# Patient Record
Sex: Female | Born: 2000 | Race: Black or African American | Hispanic: No | Marital: Single | Smoking: Never smoker
Health system: Southern US, Community
[De-identification: ages and names within clinical notes are randomized; demographics above are authoritative.]

## PROBLEM LIST (undated history)

## (undated) DIAGNOSIS — J309 Allergic rhinitis, unspecified: Secondary | ICD-10-CM

## (undated) DIAGNOSIS — J45909 Unspecified asthma, uncomplicated: Secondary | ICD-10-CM

## (undated) HISTORY — PX: TONSILLECTOMY: SUR1361

## (undated) HISTORY — DX: Allergic rhinitis, unspecified: J30.9

## (undated) HISTORY — PX: ADENOIDECTOMY: SUR15

---
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
## 2022-06-12 ENCOUNTER — Encounter: Payer: Medicaid Other | Admitting: Radiology

## 2022-08-24 ENCOUNTER — Encounter (INDEPENDENT_AMBULATORY_CARE_PROVIDER_SITE_OTHER): Payer: Self-pay

## 2022-10-10 ENCOUNTER — Ambulatory Visit
Admission: EM | Admit: 2022-10-10 | Discharge: 2022-10-10 | Disposition: A | Discharge status: Home / Self Care | Payer: Medicaid Other | Attending: Emergency Medicine | Admitting: Emergency Medicine

## 2022-10-10 DIAGNOSIS — J302 Other seasonal allergic rhinitis: Secondary | ICD-10-CM

## 2022-10-10 MED ORDER — FLUTICASONE PROPIONATE 50 MCG/ACT NA SUSP: 1.0000 | Freq: Every day | NASAL | 0 refills | Status: DC

## 2022-10-10 MED ORDER — CETIRIZINE HCL 10 MG PO TABS: 10.0000 mg | ORAL_TABLET | Freq: Every day | ORAL | 0 refills | Status: DC

## 2022-10-10 NOTE — ED Triage Notes (Signed)
Patient to Urgent Care with complaints of productive cough. Reports mucus is discolored.   Reports symptoms started two weeks ago. Reports also having headaches.

## 2022-10-10 NOTE — ED Provider Notes (Signed)
Roderic Palau    CSN: [id number] Arrival date & time: 10/10/22  1202      History   Chief Complaint Chief Complaint  Patient presents with   Cough    HPI Caitlin English is a 22 y.o. female.  Patient presents with 2 week history of intermittent headache, congestion, productive cough.  No fever, chills, ear pain, sore throat, wheezing, shortness of breath, or other symptoms.  No OTC medications taken today.  She has not needed to use her albuterol inhaler.  Her medical history includes asthma and allergies.   The history is provided by the patient and medical records.    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      Home Medications    Prior to Admission medications   Medication Sig Start Date End Date Taking? Authorizing Provider  cetirizine (ZYRTEC ALLERGY) 10 MG tablet Take 1 tablet (10 mg total) by mouth daily. 10/10/22  Yes Sharion Balloon, NP  fluticasone (FLONASE) 50 MCG/ACT nasal spray Place 1 spray into both nostrils daily. 10/10/22  Yes Sharion Balloon, NP  norethindrone-ethinyl estradiol-FE (JUNEL FE 1/20) 1-20 MG-MCG tablet TAKE 1 TABLET BY MOUTH ONCE DAILY Patient not taking: Reported on 10/10/2022 04/27/21   Theodis Sato, MD  VENTOLIN HFA 108 (90 Base) MCG/ACT inhaler Inhale 2 puffs into the  lungs every 4 (four) hours as needed for wheezing or shortness of breath. 05/03/21   Roselind Messier, MD    Family History Family History  Problem Relation Age of Onset   Hypertension Mother    Asthma Mother    Diabetes Father    Hypertension Father    Diabetes Maternal Aunt    Other Other        family history of anxiety/depression   Heart attack Maternal Grandfather     Social History Social History   Tobacco Use   Smoking status: Never    Passive exposure: Yes   Smokeless tobacco: Never   Tobacco comments:    dad outside   Substance Use Topics   Alcohol use: No   Drug use: No     Allergies   Patient has no known allergies.   Review of Systems Review of Systems  Constitutional:  Negative for chills and fever.  HENT:  Positive for congestion. Negative for ear pain and sore throat.   Respiratory:  Positive for cough. Negative for shortness of breath and wheezing.   Cardiovascular:  Negative for chest pain and palpitations.  Gastrointestinal:  Negative for diarrhea and vomiting.  Skin:  Negative for color change and rash.  Neurological:  Positive for headaches.  All other systems reviewed and are negative.    Physical Exam Triage Vital Signs ED Triage Vitals  Enc Vitals Group  BP 10/10/22 1212 114/78     Pulse Rate 10/10/22 1210 89     Resp 10/10/22 1210 18     Temp 10/10/22 1210 98.1 F (36.7 C)     Temp src --      SpO2 10/10/22 1210 98 %     Weight 10/10/22 1211 165 lb (74.8 kg)     Height 10/10/22 1211 5\' 4"  (1.626 m)     Head Circumference --      Peak Flow --      Pain Score 10/10/22 1211 0     Pain Loc --      Pain Edu? --      Excl. in [place]? --    No data found.  Updated Vital Signs BP 114/78   Pulse 89   Temp 98.1 F (36.7 C)   Resp 18   Ht 5\' 4"  (1.626 m)   Wt 165 lb (74.8 kg)   LMP 10/07/2022   SpO2 98%   BMI 28.32 kg/m   Visual Acuity Right Eye Distance:   Left Eye Distance:   Bilateral Distance:    Right Eye Near:    Left Eye Near:    Bilateral Near:     Physical Exam Vitals and nursing note reviewed.  Constitutional:      General: She is not in acute distress.    Appearance: Normal appearance. She is well-developed. She is not ill-appearing.  HENT:     Right Ear: Tympanic membrane normal.     Left Ear: Tympanic membrane normal.     Nose: Nose normal.     Mouth/Throat:     Mouth: Mucous membranes are moist.     Pharynx: Oropharynx is clear.  Cardiovascular:     Rate and Rhythm: Normal rate and regular rhythm.     Heart sounds: Normal heart sounds.  Pulmonary:     Effort: Pulmonary effort is normal. No respiratory distress.     Breath sounds: Normal breath sounds. No wheezing.  Musculoskeletal:     Cervical back: Neck supple.  Skin:    General: Skin is warm and dry.  Neurological:     Mental Status: She is alert.  Psychiatric:        Mood and Affect: Mood normal.        Behavior: Behavior normal.      UC Treatments / Results  Labs (all labs ordered are listed, but only abnormal results are displayed) Labs Reviewed - No data to display  EKG   Radiology No results found.  Procedures Procedures (including critical care time)  Medications Ordered in UC Medications - No data to display  Initial Impression / Assessment and Plan / UC Course  I have reviewed the triage vital signs and the nursing notes.  Pertinent labs & imaging results that were available during my care of the patient were reviewed by me and considered in my medical decision making (see chart for details).    Seasonal allergies.  Patient is well-appearing and her exam is reassuring.  Lungs are clear, O2 sat 98% on room air.  Treating with Flonase and Zyrtec.  Education provided on allergic rhinitis.  Instructed patient to follow up with her PCP if her symptoms are not improving.  She agrees to plan of care.    Final Clinical Impressions(s) / UC Diagnoses   Final diagnoses:  Seasonal allergies      Discharge Instructions      Use the Flonase and take the Zyrtec as directed.  Follow up with your primary care provider if your symptoms are not improving.        ED Prescriptions     Medication Sig Dispense Auth. Provider   fluticasone (FLONASE) 50 MCG/ACT nasal spray Place 1 spray into both nostrils daily. 16 g Sharion Balloon, NP   cetirizine (ZYRTEC ALLERGY) 10 MG tablet Take 1 tablet (10 mg total) by mouth daily. 14 tablet Sharion Balloon, NP      PDMP not reviewed this encounter.   Sharion Balloon, NP 10/10/22 1236

## 2022-10-10 NOTE — Discharge Instructions (Addendum)
Use the Flonase and take the Zyrtec as directed.  Follow up with your primary care provider if your symptoms are not improving.

## 2022-10-10 NOTE — ED Notes (Signed)
Appointment made for patient to establish pcp. Appointment made with Romilda Garret. Dec 08, 2022 @ [place] at [place].

## 2022-11-27 ENCOUNTER — Encounter (INDEPENDENT_AMBULATORY_CARE_PROVIDER_SITE_OTHER): Payer: Self-pay

## 2022-12-08 ENCOUNTER — Ambulatory Visit: Payer: Medicaid Other | Admitting: Nurse Practitioner

## 2023-01-18 ENCOUNTER — Ambulatory Visit
Admission: EM | Admit: 2023-01-18 | Discharge: 2023-01-18 | Disposition: A | Discharge status: Home / Self Care | Payer: Medicaid Other | Attending: Urgent Care | Admitting: Urgent Care

## 2023-01-18 DIAGNOSIS — L732 Hidradenitis suppurativa: Secondary | ICD-10-CM | POA: Diagnosis not present

## 2023-01-18 DIAGNOSIS — L02411 Cutaneous abscess of right axilla: Secondary | ICD-10-CM

## 2023-01-18 MED ORDER — DOXYCYCLINE HYCLATE 100 MG PO CAPS: 100.0000 mg | ORAL_CAPSULE | Freq: Two times a day (BID) | ORAL | 0 refills | Status: AC

## 2023-01-18 NOTE — Discharge Instructions (Signed)
I have entered a referral for you for dermatology.  Please contact their office to schedule an appointment.  I have also prescribed an antibiotic which is likely to help with your symptoms.  As discussed, if there is a pocket of discharge within the lesion, the antibiotic is not likely to resolve it without surgically draining it.  If you are unable to get a timely appointment with dermatology, you may return to urgent care for this procedure if your symptoms worsen.

## 2023-01-18 NOTE — ED Provider Notes (Signed)
Renaldo Fiddler    CSN: [id number] Arrival date & time: 01/18/23  1310      History   Chief Complaint Chief Complaint  Patient presents with   Abscess    HPI Caitlin English is a 22 y.o. female.    Abscess   Presents with complaint of abscess to right axilla.  Patient states it increased in size yesterday.  Denies drainage and fever.  PMH includes moderate persistent asthma, hidradenitis.  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      Home Medications    Prior to Admission medications   Medication Sig Start Date End Date Taking? Authorizing Provider  cetirizine (ZYRTEC ALLERGY) 10 MG tablet Take 1 tablet (10 mg total) by mouth daily. 10/10/22   Mickie Bail, NP  fluticasone (FLONASE) 50 MCG/ACT nasal spray Place 1 spray into both nostrils daily. 10/10/22   Mickie Bail, NP  norethindrone-ethinyl estradiol-FE (JUNEL FE 1/20) 1-20 MG-MCG tablet TAKE 1 TABLET BY MOUTH ONCE DAILY Patient not taking: Reported on 10/10/2022 04/27/21   Darrall Dears, MD  VENTOLIN HFA 108 (90 Base) MCG/ACT inhaler Inhale 2 puffs into the lungs every 4 (four) hours as needed for wheezing or shortness of breath. 05/03/21   Theadore Nan, MD    Family History Family History  Problem Relation Age of Onset    Hypertension Mother    Asthma Mother    Diabetes Father    Hypertension Father    Diabetes Maternal Aunt    Other Other        family history of anxiety/depression   Heart attack Maternal Grandfather     Social History Social History   Tobacco Use   Smoking status: Never    Passive exposure: Yes   Smokeless tobacco: Never   Tobacco comments:    dad outside   Substance Use Topics   Alcohol use: No   Drug use: No     Allergies   Patient has no known allergies.   Review of Systems Review of Systems   Physical Exam Triage Vital Signs ED Triage Vitals  Enc Vitals Group     BP      Pulse      Resp      Temp      Temp src      SpO2      Weight      Height      Head Circumference      Peak Flow      Pain Score      Pain Loc      Pain Edu?      Excl. in [place]?    No data found.  Updated Vital Signs There were no vitals taken for this visit.  Visual Acuity  Right Eye Distance:   Left Eye Distance:   Bilateral Distance:    Right Eye Near:   Left Eye Near:    Bilateral Near:     Physical Exam Vitals reviewed.  Constitutional:      Appearance: Normal appearance.  Chest:    Skin:    General: Skin is warm and dry.  Neurological:     General: No focal deficit present.     Mental Status: She is alert and oriented to person, place, and time.  Psychiatric:        Mood and Affect: Mood normal.        Behavior: Behavior normal.      UC Treatments / Results  Labs (all labs ordered are listed, but only abnormal results are displayed) Labs Reviewed - No data to display  EKG   Radiology No results found.  Procedures Procedures (including critical care time)  Medications Ordered in UC Medications - No data to display  Initial Impression / Assessment and Plan / UC Course  I have reviewed the triage vital signs and the nursing notes.  Pertinent labs & imaging results that were available during my care of the patient were reviewed by me and  considered in my medical decision making (see chart for details).   Given her history and location of the lesion, suspicious for recurrence of HS.  The lesion is not clearly fluctuant and after shared decision making, agreed to limit treatment to prescribed antibiotic and attempt referral to dermatology for evaluation and treatment.  Counseled patient on potential for adverse effects with medications prescribed/recommended today, ER and return-to-clinic precautions discussed, patient verbalized understanding and agreement with care plan.  Final Clinical Impressions(s) / UC Diagnoses   Final diagnoses:  None   Discharge Instructions   None    ED Prescriptions   None    PDMP not reviewed this encounter.   Charma Igo, [place] 01/18/23 1326

## 2023-01-18 NOTE — ED Triage Notes (Signed)
Patient presents to UC abscess to right axilla, states it increased in size yesterday. No drainage or fever.

## 2023-01-21 ENCOUNTER — Encounter: Payer: Self-pay | Admitting: Emergency Medicine

## 2023-01-21 ENCOUNTER — Emergency Department
Admission: EM | Admit: 2023-01-21 | Discharge: 2023-01-21 | Disposition: A | Discharge status: Home / Self Care | Payer: Medicaid Other | Attending: Emergency Medicine | Admitting: Emergency Medicine

## 2023-01-21 ENCOUNTER — Other Ambulatory Visit: Payer: Self-pay

## 2023-01-21 DIAGNOSIS — L02411 Cutaneous abscess of right axilla: Secondary | ICD-10-CM | POA: Diagnosis present

## 2023-01-21 MED ORDER — LIDOCAINE-EPINEPHRINE-TETRACAINE (LET) TOPICAL GEL
3.0000 mL | Freq: Once | TOPICAL | Status: AC
  Administered 2023-01-21: 3 mL via TOPICAL
  Filled 2023-01-21: qty 3

## 2023-01-21 MED ORDER — LIDOCAINE-EPINEPHRINE (PF) 2 %-1:200000 IJ SOLN
10.0000 mL | Freq: Once | INTRAMUSCULAR | Status: DC
  Filled 2023-01-21: qty 20

## 2023-01-21 MED ORDER — LIDOCAINE HCL (PF) 1 % IJ SOLN
5.0000 mL | Freq: Once | INTRAMUSCULAR | Status: AC
  Administered 2023-01-21: 5 mL
  Filled 2023-01-21: qty 5

## 2023-01-21 MED ORDER — CLINDAMYCIN PHOSPHATE 1 % EX GEL: CUTANEOUS | 0 refills | Status: DC

## 2023-01-21 NOTE — Discharge Instructions (Addendum)
Put a heat pack or a warm, wet washcloth on the spot. Make sure you: Cover the abscess with a bandage. Wash your hands with soap and water for at least 20 seconds before and after you change your bandage. If you cannot use soap and water, use hand sanitizer. Change your bandage Check for: More redness, swelling, or pain. More fluid or blood. Warmth. More pus or a worse smell.  Follow Up in 2-3 days for removal of packaging and wound check   Please go to the following website to schedule new (and existing) patient appointments:   [email or web address]   The following is a list of primary care offices in the area who are accepting new patients at this time.  Please reach out to one of them directly and let them know you would like to schedule an appointment to follow up on an Emergency Department visit, and/or to establish a new primary care provider (PCP).  There are likely other primary care clinics in the are who are accepting new patients, but this is an excellent place to start:  [facility] Lead physician: Dr Shirlee Latch [address] [place], [place] [address] [phone]  [facility] Lead Physician: Dr Alba Cory [address], [place], [place] [address] [phone]  [facility]  Lead Physician: Dr Olevia Perches [address] [place], [place] [address] [phone]  [facility] Lead Physician: Dr Sofie Hartigan [address] [place], [place] [address] [phone]  [facility] at [facility] Lead Physician: Dr Bari Edward [address] [place], [place], [place] [address] [phone]

## 2023-01-21 NOTE — ED Notes (Signed)
See triage note  Presents with possible abscess area under right arm  States she developed area last week    States she was seen and placed on antibiotics and has been using warm compresses  No fever

## 2023-01-21 NOTE — ED Triage Notes (Signed)
Patient is here with bilateral axilla abscesses; RIGHT is about the size of a golf ball and LEFT is very small

## 2023-01-21 NOTE — ED Provider Notes (Addendum)
[facility] Emergency Department Provider Note     Event Date/Time   First MD Initiated Contact with Patient 01/21/23 1159     (approximate)   History   Abscess (Patient is here with bilateral axilla abscesses; RIGHT is about the size of a golf ball and LEFT is very small)   HPI  Caitlin English is a 22 y.o. female with a history of hidradenitis suppurativa who presents to the ED for right axillary pain due to an abscess.  Patient was seen at urgent care 3 days ago and prescribed doxycycline for same complaint.  Patient admits to not picking up this medication.  Patient reports taking an old prescription of doxycycline that she had leftover.  She has taken 2 pills of this old prescription but not as instructed on the prescription.  Patient has a history of reoccurring abscess in both axillary areas.  She states they normally drain on their own.  She has noticed the abscess in her right axillary to increase in size over 2 days and pain is intolerable.  Denies fever and drainage.     Physical Exam   Triage Vital Signs: ED Triage Vitals  Enc Vitals Group     BP 01/21/23 1147 116/79     Pulse Rate 01/21/23 1147 75     Resp 01/21/23 1147 14     Temp 01/21/23 1147 98 F (36.7 C)     Temp Source 01/21/23 1147 Oral     SpO2 01/21/23 1147 100 %     Weight 01/21/23 1147 160 lb (72.6 kg)     Height 01/21/23 1147 5\' 4"  (1.626 m)     Head Circumference --      Peak Flow --      Pain Score 01/21/23 1146 6     Pain Loc --      Pain Edu? --      Excl. in [place]? --     Most recent vital signs: Vitals:   01/21/23 1147  BP: 116/79  Pulse: 75  Resp: 14  Temp: 98 F (36.7 C)  SpO2: 100%    General Awake, no distress.  HEENT NCAT. PERRL. EOMI. No rhinorrhea. Mucous membranes are moist. CV:  Good peripheral perfusion.  RESP:  Normal effort.  ABD:  No distention.  Other:  Right axillary reveals large sized raised, mobile, fluctuant mass with surrounding firm  induration.  There is a small pinpoint white head located center of mass. severe tenderness to palpation. No erythema.   ED Results / Procedures / Treatments   Labs (all labs ordered are listed, but only abnormal results are displayed) Labs Reviewed - No data to display  No results found.  PROCEDURES:  Critical Care performed: No  ..Incision and Drainage  Date/Time: 01/21/2023 2:41 PM  Performed by: Conrad Oak Level, PA-C Authorized by: Kern Reap A, PA-C   Consent:    Consent obtained:  Verbal   Consent given by:  Patient   Risks, benefits, and alternatives were discussed: yes     Risks discussed:  Bleeding, incomplete drainage, pain and infection   Alternatives discussed:  Observation and alternative treatment Location:    Type:  Abscess   Size:  3 cm   Location:  Upper extremity   Upper extremity location:  Arm   Arm location:  R upper arm (Axillary) Pre-procedure details:    Skin preparation:  Povidone-iodine Anesthesia:    Anesthesia method:  Topical application and local infiltration  Topical anesthetic:  LET   Local anesthetic:  Lidocaine 1% w/o epi Procedure type:    Complexity:  Complex Procedure details:    Incision types:  Stab incision   Incision depth:  Subcutaneous   Wound management:  Irrigated with saline   Drainage:  Purulent and bloody   Drainage amount:  Moderate   Packing materials:  1/4 in iodoform gauze   Amount 1/4" iodoform:  1 inch Post-procedure details:    Procedure completion:  Tolerated    MEDICATIONS ORDERED IN ED: Medications  lidocaine-EPINEPHrine-tetracaine (LET) topical gel (3 mLs Topical Given 01/21/23 1226)  lidocaine (PF) (XYLOCAINE) 1 % injection 5 mL (5 mLs Infiltration Given by Other 01/21/23 1226)   IMPRESSION / MDM / ASSESSMENT AND PLAN / ED COURSE  I reviewed the triage vital signs and the nursing notes.                               22 y.o. female presents to the emergency department for evaluation and  treatment of reoccurring hidradenitis suppurativa abscess of the right axillary. See HPI for further details.   Right axillary reveals a large fluctuant and mobile mass with a small white head prepared for drainage.  I&D procedure performed successfully.  Patient tolerated well with no complications.  Abscess was irrigated extensively with normal saline and packed with approximately 1 inch of iodoform packing strips.  A nonadhesive dressing was placed over the area.  We briefly discussed at home care.  I instructed her to pick up the doxycycline prescription from her pharmacy was prescribed by urgent care to 3 days ago and emphasized the importance of taking it as indicated for her abscess to heal correctly given that she has already gone taken an old prescription of doxycycline.  She verbalized understanding.  I prescribed clindamycin topical gel to be used adjunctively. She is instructed to return or follow-up with her primary care 2 to 3 days for packing removal and a wound check.  ED precautions to return to ED for any worsening or new symptoms discussed. All questions and concerns were addressed during ED visit.    Patient's presentation is most consistent with exacerbation of chronic illness.  FINAL CLINICAL IMPRESSION(S) / ED DIAGNOSES   Final diagnoses:  Abscess of axilla, right   Rx / DC Orders   ED Discharge Orders          Ordered    clindamycin (CLINDAGEL) 1 % gel        01/21/23 1351             Note:  This document was prepared using Dragon voice recognition software and may include unintentional dictation errors.    Kern Reap A, PA-C 01/21/23 1440    Romeo Apple, Rina Adney A, PA-C 01/21/23 1446    Trinna Post, MD 01/21/23 [phone]

## 2023-05-04 ENCOUNTER — Telehealth: Payer: Self-pay | Admitting: Family Medicine

## 2023-06-12 ENCOUNTER — Ambulatory Visit: Payer: Medicaid Other | Admitting: Family Medicine

## 2023-06-26 ENCOUNTER — Ambulatory Visit (INDEPENDENT_AMBULATORY_CARE_PROVIDER_SITE_OTHER): Payer: Medicaid Other | Admitting: Family Medicine

## 2023-06-26 VITALS — BP 108/68 | HR 81 | Temp 98.7°F | Ht 63.0 in | Wt 167.0 lb

## 2023-06-26 DIAGNOSIS — Z13220 Encounter for screening for lipoid disorders: Secondary | ICD-10-CM

## 2023-06-26 DIAGNOSIS — J45990 Exercise induced bronchospasm: Secondary | ICD-10-CM

## 2023-06-26 DIAGNOSIS — Z0001 Encounter for general adult medical examination with abnormal findings: Secondary | ICD-10-CM | POA: Diagnosis not present

## 2023-06-26 DIAGNOSIS — Z30011 Encounter for initial prescription of contraceptive pills: Secondary | ICD-10-CM

## 2023-06-26 DIAGNOSIS — Z1159 Encounter for screening for other viral diseases: Secondary | ICD-10-CM

## 2023-06-26 DIAGNOSIS — N921 Excessive and frequent menstruation with irregular cycle: Secondary | ICD-10-CM | POA: Diagnosis not present

## 2023-06-26 DIAGNOSIS — L732 Hidradenitis suppurativa: Secondary | ICD-10-CM | POA: Diagnosis not present

## 2023-06-26 DIAGNOSIS — Z0000 Encounter for general adult medical examination without abnormal findings: Secondary | ICD-10-CM

## 2023-06-26 LAB — POCT URINE PREGNANCY: Preg Test, Ur: NEGATIVE

## 2023-06-26 NOTE — Progress Notes (Signed)
New patient visit   Patient: Caitlin English   DOB: 11-09-2000   22 y.o. Female  MRN: [id number] Visit Date: 06/26/2023  Today's healthcare provider: Sherlyn Hay, DO   Chief Complaint  Patient presents with   New Patient (Initial Visit)    Establish care with provider at this office.    Subjective    Caitlin English is a 22 y.o. female who presents today as a new patient to establish care, as she is transitioning from her pediatric doctor.  HPI HPI     New Patient (Initial Visit)    Additional comments: Establish care with provider at this office.       Last edited by Theola Sequin, CMA on 06/26/2023  1:49 PM.      No longer using clindamycin gel?  For hidradenitis suppurativa LMP: 06/25/2023    The patient presents for a routine check-up and to establish care after a period of not seeing a doctor. She reports a history of asthma, allergies, and hidradenitis suppurativa. The patient's asthma is reportedly well-controlled, with no recent exacerbations requiring steroids. She uses Ventolin as needed, primarily for exercise-induced symptoms, but reports no recent need for the inhaler.  Regarding her skin condition, she reports a recent small bump that resolved on its own. She has been prescribed clindamycin gel and doxycycline in the past, but has not been using these medications recently. She reports no recent issues with her skin condition, but has had a prescription for chlorhexidine gluconate, a wash for her skin condition, in the past.  The patient also reports a history of heavy menstrual bleeding, for which she was previously on birth control. She expresses interest in restarting birth control due to a recent period that lasted two weeks. She has been on Junel-Fe and Loestrin in the past and expresses interest in possibly switching to a different form of birth control, such as an IUD or Nexplanon.  The patient reports no other significant health concerns. She denies any  recent fatigue, changes to hair, skin, or nails, and any numbness or tingling. She does report recent back pain, which she attributes to lifting boxes at work. She denies any other symptoms related to this back pain.  The patient is sexually active and uses protection. She has received a flu shot and a COVID-19 vaccine earlier this year. She denies any history of anemia or high cholesterol. She reports no recent nausea, vomiting, constipation, or diarrhea.   Past Medical History:  Diagnosis Date   Allergic rhinitis    Asthma    Encounter for initial prescription of contraceptive pills 07/08/2023   Past Surgical History:  Procedure Laterality Date   ADENOIDECTOMY     TONSILLECTOMY     Family Status  Relation Name Status   Mother Lattie Corns Alive   Father Ethelene Browns Alive   Mat Rulon Sera (Not Specified)   Other  (Not Specified)   MGF  Deceased   MGM  Deceased   PGM  Alive   PGF  Deceased  No partnership data on file   Family History  Problem Relation Age of Onset   Hypertension Mother    Asthma Mother    Diabetes Father    Hypertension Father    Diabetes Maternal Aunt    Other Other        family history of anxiety/depression   Heart attack Maternal Grandfather    Social History   Socioeconomic History   Marital status: Single    Spouse  name: Not on file   Number of children: Not on file   Years of education: Not on file   Highest education level: Some college, no degree  Occupational History   Not on file  Tobacco Use   Smoking status: Never    Passive exposure: Yes   Smokeless tobacco: Never   Tobacco comments:    dad outside   Substance and Sexual Activity   Alcohol use: No   Drug use: No   Sexual activity: Yes    Birth control/protection: None  Other Topics Concern   Not on file  Social History Narrative   12th grade [employer] 22-23 school year. Lives with mom. 1 dog, Max   Social Drivers of Corporate investment banker Strain: Low Risk   (06/26/2023)   Overall Financial Resource Strain (CARDIA)    Difficulty of Paying Living Expenses: Not very hard  Food Insecurity: No Food Insecurity (06/26/2023)   Hunger Vital Sign    Worried About Running Out of Food in the Last Year: Never true    Ran Out of Food in the Last Year: Never true  Transportation Needs: No Transportation Needs (06/26/2023)   PRAPARE - Administrator, Civil Service (Medical): No    Lack of Transportation (Non-Medical): No  Physical Activity: Sufficiently Active (06/26/2023)   Exercise Vital Sign    Days of Exercise per Week: 6 days    Minutes of Exercise per Session: 60 min  Stress: No Stress Concern Present (06/26/2023)   [employer] of Occupational Health - Occupational Stress Questionnaire    Feeling of Stress : Only a little  Social Connections: Socially Isolated (06/26/2023)   Social Connection and Isolation Panel [NHANES]    Frequency of Communication with Friends and Family: Once a week    Frequency of Social Gatherings with Friends and Family: Twice a week    Attends Religious Services: Never    Database administrator or Organizations: No    Attends Engineer, structural: Not on file    Marital Status: Never married   Outpatient Medications Prior to Visit  Medication Sig   VENTOLIN HFA 108 (90 Base) MCG/ACT inhaler Inhale 2 puffs into the lungs every 4 (four) hours as needed for wheezing or shortness of breath.   [DISCONTINUED] cetirizine (ZYRTEC ALLERGY) 10 MG tablet Take 1 tablet (10 mg total) by mouth daily.   [DISCONTINUED] clindamycin (CLINDAGEL) 1 % gel Apply to affected area 2 times daily   [DISCONTINUED] fluticasone (FLONASE) 50 MCG/ACT nasal spray Place 1 spray into both nostrils daily.   [DISCONTINUED] norethindrone-ethinyl estradiol-FE (JUNEL FE 1/20) 1-20 MG-MCG tablet TAKE 1 TABLET BY MOUTH ONCE DAILY (Patient not taking: Reported on 10/10/2022)   No facility-administered medications prior to visit.   No Known  Allergies  Immunization History  Administered Date(s) Administered   DTaP 06/25/2003, 08/27/2003, 10/27/2003, 07/29/2004, 04/30/2007   HIB (PRP-OMP) 06/25/2003, 08/27/2003, 10/27/2003, 07/29/2004   HPV 9-valent 08/12/2014, 05/12/2015   HPV Quadrivalent 05/06/2014   Hepatitis A 03/30/2009, 04/21/2010   Hepatitis B 05/16/03, 06/25/2003, 08/27/2003, 10/27/2003   IPV 06/25/2003, 08/27/2003, 10/27/2003, 04/30/2007   Influenza Nasal 05/07/2009, 04/21/2010, 05/12/2011, 05/13/2012, 05/02/2013   Influenza,Quad,Nasal, Live 05/02/2013, 05/06/2014   Influenza,inj,Quad PF,6+ Mos 05/12/2015, 06/06/2016, 05/15/2017, 04/17/2018, 06/28/2019, 04/18/2021, 06/19/2023   MMR 04/27/2004, 04/30/2007   Meningococcal Conjugate 05/06/2014, 09/29/2019   PFIZER Comirnaty(Gray Top)Covid-19 Tri-Sucrose Vaccine 09/04/2020   Pneumococcal Conjugate-13 06/25/2003, 08/27/2003, 10/27/2003, 04/27/2004   Tdap 05/06/2014   Varicella 04/27/2004, 04/30/2007  Health Maintenance  Topic Date Due   Pneumococcal Vaccine 20-14 Years old (1 of 1 - PPSV23 or PCV20) 04/18/2009   CHLAMYDIA SCREENING  07/25/2023 (Originally 05/16/2022)   COVID-19 Vaccine (2 - 2024-25 season) 03/24/2024 (Originally 03/25/2023)   DTaP/Tdap/Td (7 - Td or Tdap) 05/06/2024   INFLUENZA VACCINE  Completed   HPV VACCINES  Completed   Hepatitis C Screening  Completed   HIV Screening  Completed    Patient Care Team: Sherlyn Hay, DO as PCP - General (Family Medicine)  Review of Systems  Constitutional:  Negative for chills, fatigue and fever.  HENT:  Negative for congestion, ear pain, rhinorrhea, sneezing and sore throat.   Eyes: Negative.  Negative for pain and redness.  Respiratory:  Negative for cough, shortness of breath and wheezing.   Cardiovascular:  Negative for chest pain and leg swelling.  Gastrointestinal:  Negative for abdominal pain, blood in stool, constipation, diarrhea and nausea.  Endocrine: Negative for polydipsia and  polyphagia.  Genitourinary: Negative.  Negative for dysuria, flank pain, hematuria, pelvic pain, vaginal bleeding and vaginal discharge.  Musculoskeletal:  Positive for back pain. Negative for arthralgias, gait problem and joint swelling.  Skin:  Negative for rash.  Neurological: Negative.  Negative for dizziness, tremors, seizures, weakness, light-headedness, numbness and headaches.  Hematological:  Negative for adenopathy.  Psychiatric/Behavioral: Negative.  Negative for behavioral problems, confusion and dysphoric mood. The patient is not nervous/anxious and is not hyperactive.         Objective    BP 108/68 (BP Location: Left Arm, Patient Position: Sitting, Cuff Size: Normal)   Pulse 81   Temp 98.7 F (37.1 C) (Oral)   Ht 5\' 3"  (1.6 m)   Wt 167 lb (75.8 kg)   LMP 06/25/2023   SpO2 100%   BMI 29.58 kg/m     Physical Exam Vitals and nursing note reviewed.  Constitutional:      General: She is awake.     Appearance: Normal appearance.  HENT:     Head: Normocephalic and atraumatic.     Right Ear: Tympanic membrane, ear canal and external ear normal.     Left Ear: Tympanic membrane, ear canal and external ear normal.     Nose: Nose normal.     Mouth/Throat:     Mouth: Mucous membranes are moist.     Pharynx: Oropharynx is clear. No oropharyngeal exudate or posterior oropharyngeal erythema.  Eyes:     General: No scleral icterus.    Extraocular Movements: Extraocular movements intact.     Conjunctiva/sclera: Conjunctivae normal.     Pupils: Pupils are equal, round, and reactive to light.  Neck:     Thyroid: No thyromegaly or thyroid tenderness.  Cardiovascular:     Rate and Rhythm: Normal rate and regular rhythm.     Pulses: Normal pulses.     Heart sounds: Normal heart sounds.  Pulmonary:     Effort: Pulmonary effort is normal. No tachypnea, bradypnea or respiratory distress.     Breath sounds: Normal breath sounds. No stridor. No wheezing, rhonchi or rales.   Abdominal:     General: Bowel sounds are normal. There is no distension.     Palpations: Abdomen is soft. There is no mass.     Tenderness: There is no abdominal tenderness. There is no guarding.     Hernia: No hernia is present.  Musculoskeletal:     Cervical back: Normal range of motion and neck supple.     Right lower  leg: No edema.     Left lower leg: No edema.  Lymphadenopathy:     Cervical: No cervical adenopathy.  Skin:    General: Skin is warm and dry.  Neurological:     Mental Status: She is alert and oriented to person, place, and time. Mental status is at baseline.  Psychiatric:        Mood and Affect: Mood normal.        Behavior: Behavior normal.     Depression Screen    06/26/2023    1:54 PM 08/24/2021    1:48 PM 08/23/2021    3:47 PM  PHQ 2/9 Scores  PHQ - 2 Score 1 5 5   PHQ- 9 Score 5 14 14    Results for orders placed or performed in visit on 06/26/23  Comprehensive metabolic panel  Result Value Ref Range   Glucose 78 70 - 99 mg/dL   BUN 9 6 - 20 mg/dL   Creatinine, Ser 7.84 0.57 - 1.00 mg/dL   eGFR 696 >29 BM/WUX/3.24   BUN/Creatinine Ratio 12 9 - 23   Sodium 139 134 - 144 mmol/L   Potassium 3.9 3.5 - 5.2 mmol/L   Chloride 105 96 - 106 mmol/L   CO2 23 20 - 29 mmol/L   Calcium 8.9 8.7 - 10.2 mg/dL   Total Protein 7.3 6.0 - 8.5 g/dL   Albumin 4.4 4.0 - 5.0 g/dL   Globulin, Total 2.9 1.5 - 4.5 g/dL   Bilirubin Total 0.3 0.0 - 1.2 mg/dL   Alkaline Phosphatase 86 42 - 106 IU/L   AST 21 0 - 40 IU/L   ALT 9 0 - 32 IU/L  Lipid panel  Result Value Ref Range   Cholesterol, Total 183 100 - 199 mg/dL   Triglycerides 55 0 - 149 mg/dL   HDL 47 >40 mg/dL   VLDL Cholesterol Cal 11 5 - 40 mg/dL   LDL Chol Calc (NIH) 102 (H) 0 - 99 mg/dL   Chol/HDL Ratio 3.9 0.0 - 4.4 ratio  HCV Ab w Reflex to Quant PCR  Result Value Ref Range   HCV Ab Non Reactive Non Reactive  Interpretation:  Result Value Ref Range   HCV Interp 1: Comment   POCT urine pregnancy   Result Value Ref Range   Preg Test, Ur Negative Negative    Assessment & Plan     Encounter for medical examination to establish care Assessment & Plan: Physical exam overall unremarkable except as noted above. Routine lab work ordered as noted.Up to date on flu shot and received a COVID booster in May. No anemia or hypercholesterolemia. Family history of hypertension. Discussed importance of baseline blood work and screening for hepatitis C and STDs. - Order baseline blood work including cholesterol panel - Screen for hepatitis C - Perform STD screening at next visit - Recommend annual follow-up - Call or use MyChart for appointments if needed.   Orders: -     Comprehensive metabolic panel -     Norethin Ace-Eth Estrad-FE; Take 1 tablet by mouth daily.  Dispense: 84 tablet; Refill: 3  Hydradenitis Assessment & Plan: Not having acute problems (only one lump in several months, which resolved on it's own) Will restart clindamycin gel and/or alternative if problems occur again.   Menorrhagia with irregular cycle Assessment & Plan: Reports heavy menstrual bleeding lasting up to two weeks. Previously managed with birth control pills. Interested in resuming birth control. Discussed alternative methods including patches, vaginal ring, Depo, Nexplanon, and  IUD. Explained IUDs have the highest success with decreased bleeding but may cause cramping. Nexplanon may cause initial irregular bleeding but often stabilizes after six months. Prefers to restart the pill and consider alternatives later. - Perform urine pregnancy test - Prescribe Junel Fe after negative pregnancy test - Refer to OB-GYN for IUD if chosen   Encounter for initial prescription of contraceptive pills -     POCT urine pregnancy -     Norethin Ace-Eth Estrad-FE; Take 1 tablet by mouth daily.  Dispense: 84 tablet; Refill: 3  Mild exercise-induced asthma Assessment & Plan: Exercise-induced asthma, well-controlled. No  recent exacerbations or need for steroids. Rarely uses Ventolin. No nocturnal symptoms or interference with daily activities. - Continue current management with Ventolin as needed   Screening for lipid disorders -     Lipid panel  Encounter for hepatitis C screening test for low risk patient -     HCV Ab w Reflex to Quant PCR -     Interpretation:    Return in about 1 year (around 06/25/2024), or Pap.     I discussed the assessment and treatment plan with the patient  The patient was provided an opportunity to ask questions and all were answered. The patient agreed with the plan and demonstrated an understanding of the instructions.   The patient was advised to call back or seek an in-person evaluation if the symptoms worsen or if the condition fails to improve as anticipated.    Sherlyn Hay, DO  [facility] [phone] (phone) [phone] (fax)  [facility]

## 2023-06-26 NOTE — Assessment & Plan Note (Signed)
Not having acute problems (only one lump in several months, which resolved on it's own) Will restart clindamycin gel and/or alternative if problems occur again.

## 2023-06-27 LAB — COMPREHENSIVE METABOLIC PANEL
ALT: 9 [IU]/L (ref 0–32)
AST: 21 [IU]/L (ref 0–40)
Albumin: 4.4 g/dL (ref 4.0–5.0)
Alkaline Phosphatase: 86 [IU]/L (ref 42–106)
BUN/Creatinine Ratio: 12 (ref 9–23)
BUN: 9 mg/dL (ref 6–20)
Bilirubin Total: 0.3 mg/dL (ref 0.0–1.2)
CO2: 23 mmol/L (ref 20–29)
Calcium: 8.9 mg/dL (ref 8.7–10.2)
Chloride: 105 mmol/L (ref 96–106)
Creatinine, Ser: 0.74 mg/dL (ref 0.57–1.00)
Globulin, Total: 2.9 g/dL (ref 1.5–4.5)
Glucose: 78 mg/dL (ref 70–99)
Potassium: 3.9 mmol/L (ref 3.5–5.2)
Sodium: 139 mmol/L (ref 134–144)
Total Protein: 7.3 g/dL (ref 6.0–8.5)
eGFR: 119 mL/min/{1.73_m2} (ref >59)

## 2023-06-27 LAB — LIPID PANEL
Chol/HDL Ratio: 3.9 {ratio} (ref 0.0–4.4)
Cholesterol, Total: 183 mg/dL (ref 100–199)
HDL: 47 mg/dL (ref >39)
LDL Chol Calc (NIH): 125 mg/dL — ABNORMAL HIGH (ref 0–99)
Triglycerides: 55 mg/dL (ref 0–149)
VLDL Cholesterol Cal: 11 mg/dL (ref 5–40)

## 2023-06-27 LAB — HCV AB W REFLEX TO QUANT PCR: HCV Ab: NONREACTIVE

## 2023-06-27 LAB — HCV INTERPRETATION

## 2023-07-08 ENCOUNTER — Encounter: Payer: Self-pay | Admitting: Family Medicine

## 2023-07-08 DIAGNOSIS — Z30011 Encounter for initial prescription of contraceptive pills: Secondary | ICD-10-CM

## 2023-07-08 DIAGNOSIS — Z0000 Encounter for general adult medical examination without abnormal findings: Secondary | ICD-10-CM | POA: Insufficient documentation

## 2023-07-08 DIAGNOSIS — N920 Excessive and frequent menstruation with regular cycle: Secondary | ICD-10-CM | POA: Insufficient documentation

## 2023-07-08 HISTORY — DX: Encounter for initial prescription of contraceptive pills: Z30.011

## 2023-07-08 MED ORDER — NORETHIN ACE-ETH ESTRAD-FE 1-20 MG-MCG PO TABS: 1.0000 | ORAL_TABLET | Freq: Every day | ORAL | 3 refills | Status: DC

## 2023-07-08 NOTE — Assessment & Plan Note (Signed)
Reports heavy menstrual bleeding lasting up to two weeks. Previously managed with birth control pills. Interested in resuming birth control. Discussed alternative methods including patches, vaginal ring, Depo, Nexplanon, and IUD. Explained IUDs have the highest success with decreased bleeding but may cause cramping. Nexplanon may cause initial irregular bleeding but often stabilizes after six months. Prefers to restart the pill and consider alternatives later. - Perform urine pregnancy test - Prescribe Junel Fe after negative pregnancy test - Refer to OB-GYN for IUD if chosen

## 2023-07-08 NOTE — Assessment & Plan Note (Signed)
Exercise-induced asthma, well-controlled. No recent exacerbations or need for steroids. Rarely uses Ventolin. No nocturnal symptoms or interference with daily activities. - Continue current management with Ventolin as needed

## 2023-07-08 NOTE — Assessment & Plan Note (Signed)
Physical exam overall unremarkable except as noted above. Routine lab work ordered as noted.Up to date on flu shot and received a COVID booster in May. No anemia or hypercholesterolemia. Family history of hypertension. Discussed importance of baseline blood work and screening for hepatitis C and STDs. - Order baseline blood work including cholesterol panel - Screen for hepatitis C - Perform STD screening at next visit - Recommend annual follow-up - Call or use MyChart for appointments if needed.

## 2023-08-23 ENCOUNTER — Other Ambulatory Visit: Payer: Self-pay

## 2023-08-23 DIAGNOSIS — R197 Diarrhea, unspecified: Secondary | ICD-10-CM | POA: Insufficient documentation

## 2023-08-23 DIAGNOSIS — Z5321 Procedure and treatment not carried out due to patient leaving prior to being seen by health care provider: Secondary | ICD-10-CM | POA: Insufficient documentation

## 2023-08-23 DIAGNOSIS — R109 Unspecified abdominal pain: Secondary | ICD-10-CM | POA: Diagnosis not present

## 2023-08-23 DIAGNOSIS — R1110 Vomiting, unspecified: Secondary | ICD-10-CM | POA: Insufficient documentation

## 2023-08-23 LAB — COMPREHENSIVE METABOLIC PANEL
ALT: 15 U/L (ref 0–44)
AST: 24 U/L (ref 15–41)
Albumin: 4.7 g/dL (ref 3.5–5.0)
Alkaline Phosphatase: 74 U/L (ref 38–126)
Anion gap: 12 (ref 5–15)
BUN: 18 mg/dL (ref 6–20)
CO2: 24 mmol/L (ref 22–32)
Calcium: 9.2 mg/dL (ref 8.9–10.3)
Chloride: 104 mmol/L (ref 98–111)
Creatinine, Ser: 0.8 mg/dL (ref 0.44–1.00)
GFR, Estimated: >60 mL/min (ref >60)
Glucose, Bld: 112 mg/dL — ABNORMAL HIGH (ref 70–99)
Potassium: 3.9 mmol/L (ref 3.5–5.1)
Sodium: 140 mmol/L (ref 135–145)
Total Bilirubin: 0.7 mg/dL (ref 0.0–1.2)
Total Protein: 8.3 g/dL — ABNORMAL HIGH (ref 6.5–8.1)

## 2023-08-23 LAB — CBC
HCT: 46.4 % — ABNORMAL HIGH (ref 36.0–46.0)
Hemoglobin: 15.3 g/dL — ABNORMAL HIGH (ref 12.0–15.0)
MCH: 28.2 pg (ref 26.0–34.0)
MCHC: 33 g/dL (ref 30.0–36.0)
MCV: 85.5 fL (ref 80.0–100.0)
Platelets: 186 10*3/uL (ref 150–400)
RBC: 5.43 MIL/uL — ABNORMAL HIGH (ref 3.87–5.11)
RDW: 13.1 % (ref 11.5–15.5)
WBC: 9 10*3/uL (ref 4.0–10.5)
nRBC: 0 % (ref 0.0–0.2)

## 2023-08-23 LAB — URINALYSIS, ROUTINE W REFLEX MICROSCOPIC
Bacteria, UA: NONE SEEN
Bilirubin Urine: NEGATIVE
Glucose, UA: NEGATIVE mg/dL
Hgb urine dipstick: NEGATIVE
Ketones, ur: 20 mg/dL — AB
Leukocytes,Ua: NEGATIVE
Nitrite: NEGATIVE
Protein, ur: 30 mg/dL — AB
Specific Gravity, Urine: 1.034 — ABNORMAL HIGH (ref 1.005–1.030)
pH: 5 (ref 5.0–8.0)

## 2023-08-23 LAB — LIPASE, BLOOD: Lipase: 26 U/L (ref 11–51)

## 2023-08-23 LAB — POC URINE PREG, ED: Preg Test, Ur: NEGATIVE

## 2023-08-23 MED ORDER — ONDANSETRON 4 MG PO TBDP
4.0000 mg | ORAL_TABLET | Freq: Once | ORAL | Status: AC | PRN
  Administered 2023-08-23: 4 mg via ORAL
  Filled 2023-08-23: qty 1

## 2023-08-23 NOTE — ED Triage Notes (Signed)
Pt reports vomiting and diarrhea that began this morning. Pt does endorse abdominal cramping prior to vomiting/diarrhea only.

## 2023-08-24 ENCOUNTER — Emergency Department
Admission: EM | Admit: 2023-08-24 | Discharge: 2023-08-24 | Discharge status: Against Medical Advice | Payer: Medicaid Other | Attending: Emergency Medicine | Admitting: Emergency Medicine

## 2023-08-24 NOTE — ED Notes (Signed)
 No answer when called several times from lobby

## 2024-06-17 ENCOUNTER — Encounter: Admitting: Family Medicine

## 2024-07-28 ENCOUNTER — Encounter: Admitting: Family Medicine

## 2024-08-01 ENCOUNTER — Encounter: Payer: Self-pay | Admitting: Family Medicine

## 2024-08-01 ENCOUNTER — Other Ambulatory Visit (HOSPITAL_COMMUNITY)
Admission: RE | Admit: 2024-08-01 | Discharge: 2024-08-01 | Disposition: A | Source: Ambulatory Visit | Attending: Family Medicine | Admitting: Family Medicine

## 2024-08-01 ENCOUNTER — Other Ambulatory Visit (HOSPITAL_COMMUNITY)
Admission: RE | Admit: 2024-08-01 | Discharge: 2024-08-01 | Disposition: A | Discharge status: Home / Self Care | Source: Ambulatory Visit | Attending: Family Medicine | Admitting: Family Medicine

## 2024-08-01 ENCOUNTER — Ambulatory Visit (INDEPENDENT_AMBULATORY_CARE_PROVIDER_SITE_OTHER): Admitting: Family Medicine

## 2024-08-01 VITALS — BP 110/67 | HR 88 | Ht 63.0 in | Wt 178.0 lb

## 2024-08-01 DIAGNOSIS — Z30013 Encounter for initial prescription of injectable contraceptive: Secondary | ICD-10-CM | POA: Diagnosis not present

## 2024-08-01 DIAGNOSIS — Z124 Encounter for screening for malignant neoplasm of cervix: Secondary | ICD-10-CM | POA: Diagnosis present

## 2024-08-01 DIAGNOSIS — Z23 Encounter for immunization: Secondary | ICD-10-CM | POA: Diagnosis not present

## 2024-08-01 DIAGNOSIS — Z118 Encounter for screening for other infectious and parasitic diseases: Secondary | ICD-10-CM | POA: Diagnosis present

## 2024-08-01 DIAGNOSIS — Z0000 Encounter for general adult medical examination without abnormal findings: Secondary | ICD-10-CM

## 2024-08-01 DIAGNOSIS — L732 Hidradenitis suppurativa: Secondary | ICD-10-CM | POA: Diagnosis not present

## 2024-08-01 DIAGNOSIS — L910 Hypertrophic scar: Secondary | ICD-10-CM

## 2024-08-01 DIAGNOSIS — B3731 Acute candidiasis of vulva and vagina: Secondary | ICD-10-CM | POA: Insufficient documentation

## 2024-08-01 DIAGNOSIS — R221 Localized swelling, mass and lump, neck: Secondary | ICD-10-CM

## 2024-08-01 DIAGNOSIS — Z0001 Encounter for general adult medical examination with abnormal findings: Secondary | ICD-10-CM

## 2024-08-01 LAB — POCT URINE PREGNANCY: Preg Test, Ur: NEGATIVE

## 2024-08-01 MED ORDER — CLINDAMYCIN PHOSPHATE 1 % EX SOLN: 1.0000 | Freq: Two times a day (BID) | CUTANEOUS | 2 refills | Status: AC

## 2024-08-01 MED ORDER — FLUCONAZOLE 150 MG PO TABS: 150.0000 mg | ORAL_TABLET | ORAL | 0 refills | Status: AC

## 2024-08-01 MED ORDER — MEDROXYPROGESTERONE ACETATE 150 MG/ML IM SUSP
150.0000 mg | Freq: Once | INTRAMUSCULAR | Status: AC
  Administered 2024-08-01: 150 mg via INTRAMUSCULAR

## 2024-08-01 NOTE — Progress Notes (Signed)
 "    Complete physical exam   Patient: Caitlin English   DOB: Jul 17, 2001   24 y.o. Female  MRN: [id number] Visit Date: 08/01/2024  Today's healthcare provider: LAURAINE LOISE BUOY, DO   Chief Complaint  Patient presents with   Annual Exam    Last completed n/a Diet -  balanced Exercise - gym 3xs a week for 1 hour minimum Feeling - well Sleeping - pretty good Concerns - none    Subjective    Caitlin English is a 24 y.o. female who presents today for a complete physical exam.   HPI HPI     Annual Exam    Additional comments: Last completed n/a Diet -  balanced Exercise - gym 3xs a week for 1 hour minimum Feeling - well Sleeping - pretty good Concerns - none       Last edited by Lilian Fitzpatrick, CMA on 08/01/2024  2:52 PM.       Caitlin English is a 24 year old female who presents for a routine physical exam and Pap smear.  She has experienced a change in appetite, sometimes consuming as little as one meal a day. Her meals are normal-sized, and she occasionally snacks on a small bag of chips. She is actively trying to improve her diet.  Earlier this week, she developed a painful bump on her thigh, similar to previous bumps under her arms. The bump causes discomfort when walking, and she has been using a Band-Aid for relief. She does not currently have clindamycin  gel, which she used previously for similar issues.  She has a keloid on the back of her ear following a piercing. The keloid has been present for a few months and increased in size after removing the piercing.  She is sexually active and not using condoms. She previously used oral contraceptives but prefers not to use them again due to the inconvenience of remembering to take them. Her last menstrual cycle was around the beginning of December, and her periods are irregular, not occurring every month.  Denies vaginal discharge, itching, odor, and concerns about sexually transmitted infections.    Past Medical History:   Diagnosis Date   Allergic rhinitis    Asthma    Encounter for initial prescription of contraceptive pills 07/08/2023   Past Surgical History:  Procedure Laterality Date   ADENOIDECTOMY     TONSILLECTOMY     Social History   Socioeconomic History   Marital status: Single    Spouse name: Not on file   Number of children: Not on file   Years of education: Not on file   Highest education level: Some college, no degree  Occupational History   Not on file  Tobacco Use   Smoking status: Never    Passive exposure: Yes   Smokeless tobacco: Never   Tobacco comments:    dad outside   Substance and Sexual Activity   Alcohol use: Not Currently   Drug use: Never   Sexual activity: Yes    Birth control/protection: Condom, None  Other Topics Concern   Not on file  Social History Narrative   12th grade [employer] 22-23 school year. Lives with mom. 1 dog, Max   Social Drivers of Health   Tobacco Use: Medium Risk (08/13/2024)   Patient History    Smoking Tobacco Use: Never    Smokeless Tobacco Use: Never    Passive Exposure: Yes  Financial Resource Strain: Low Risk (06/26/2023)   Overall Financial Resource Strain (CARDIA)  Difficulty of Paying Living Expenses: Not very hard  Food Insecurity: No Food Insecurity (06/26/2023)   Hunger Vital Sign    Worried About Running Out of Food in the Last Year: Never true    Ran Out of Food in the Last Year: Never true  Transportation Needs: No Transportation Needs (06/26/2023)   PRAPARE - Administrator, Civil Service (Medical): No    Lack of Transportation (Non-Medical): No  Physical Activity: Sufficiently Active (06/26/2023)   Exercise Vital Sign    Days of Exercise per Week: 6 days    Minutes of Exercise per Session: 60 min  Stress: No Stress Concern Present (06/26/2023)   [employer] of Occupational Health - Occupational Stress Questionnaire    Feeling of Stress : Only a little  Social Connections: Socially  Isolated (06/26/2023)   Social Connection and Isolation Panel    Frequency of Communication with Friends and Family: Once a week    Frequency of Social Gatherings with Friends and Family: Twice a week    Attends Religious Services: Never    Database Administrator or Organizations: No    Attends Engineer, Structural: Not on file    Marital Status: Never married  Intimate Partner Violence: Not on file  Depression (PHQ2-9): Low Risk (08/01/2024)   Depression (PHQ2-9)    PHQ-2 Score: 4  Alcohol Screen: Low Risk (06/26/2023)   Alcohol Screen    Last Alcohol Screening Score (AUDIT): 1  Housing: Low Risk (06/26/2023)   Housing    Last Housing Risk Score: 0  Utilities: Not on file  Health Literacy: Not on file   Family Status  Relation Name Status   Mother Alfrieda Alive   Father Curtistine Alive   Mat Wylie Lyons (Not Specified)   Other  (Not Specified)   MGF  Deceased   MGM  Deceased   PGM  Alive   PGF  Deceased  No partnership data on file   Family History  Problem Relation Age of Onset   Hypertension Mother    Asthma Mother    Diabetes Father    Hypertension Father    Diabetes Maternal Aunt    Other Other        family history of anxiety/depression   Heart attack Maternal Grandfather    Allergies[1]  Patient Care Team: Floreine Kingdon, Lauraine SAILOR, DO as PCP - General (Family Medicine)   Medications: Show/hide medication list[2]  Review of Systems  Constitutional:  Negative for chills, fatigue and fever.  HENT:  Negative for congestion, ear pain, rhinorrhea, sneezing and sore throat.   Eyes: Negative.  Negative for pain and redness.  Respiratory:  Negative for cough, shortness of breath and wheezing.   Cardiovascular:  Negative for chest pain and leg swelling.  Gastrointestinal:  Negative for abdominal pain, blood in stool, constipation, diarrhea and nausea.  Endocrine: Negative for polydipsia and polyphagia.  Genitourinary: Negative.  Negative for dysuria, flank pain,  hematuria, pelvic pain, vaginal bleeding and vaginal discharge.  Musculoskeletal:  Negative for arthralgias, back pain, gait problem and joint swelling.  Skin:  Negative for rash.  Neurological: Negative.  Negative for dizziness, tremors, seizures, weakness, light-headedness, numbness and headaches.  Hematological:  Negative for adenopathy.  Psychiatric/Behavioral: Negative.  Negative for behavioral problems, confusion and dysphoric mood. The patient is not nervous/anxious and is not hyperactive.        Objective    BP 110/67 (BP Location: Left Arm, Patient Position: Sitting, Cuff Size: Normal)  Pulse 88   Ht 5' 3 (1.6 m)   Wt 178 lb (80.7 kg)   LMP 07/07/2024   SpO2 99%   BMI 31.53 kg/m    Physical Exam Vitals and nursing note reviewed. Exam conducted with a chaperone present.  Constitutional:      General: She is awake.     Appearance: Normal appearance.  HENT:     Head: Normocephalic and atraumatic.     Right Ear: Tympanic membrane, ear canal and external ear normal.     Left Ear: Tympanic membrane, ear canal and external ear normal.     Ears:      Comments: Keloids to right eye as noted    Nose: Nose normal.     Mouth/Throat:     Mouth: Mucous membranes are moist.     Pharynx: Oropharynx is clear. No oropharyngeal exudate or posterior oropharyngeal erythema.  Eyes:     General: No scleral icterus.    Extraocular Movements: Extraocular movements intact.     Conjunctiva/sclera: Conjunctivae normal.     Pupils: Pupils are equal, round, and reactive to light.  Neck:     Thyroid : No thyromegaly or thyroid  tenderness.      Comments: Approx. 1 cm mobile mass noted to left anterolateral neck. Cardiovascular:     Rate and Rhythm: Normal rate and regular rhythm.     Pulses: Normal pulses.     Heart sounds: Normal heart sounds.  Pulmonary:     Effort: Pulmonary effort is normal. No tachypnea, bradypnea or respiratory distress.     Breath sounds: Normal breath sounds.  No stridor. No wheezing, rhonchi or rales.  Abdominal:     General: Bowel sounds are normal. There is no distension.     Palpations: Abdomen is soft. There is no mass.     Tenderness: There is no abdominal tenderness. There is no guarding.     Hernia: No hernia is present. There is no hernia in the left inguinal area or right inguinal area.  Genitourinary:    Exam position: Lithotomy position.     Pubic Area: No rash.      Tanner stage (genital): 5.     Labia:        Right: No rash, tenderness, lesion or injury.        Left: No rash, tenderness, lesion or injury.      Vagina: No signs of injury and foreign body. Vaginal discharge (moderate white) present. No erythema, tenderness, bleeding, lesions or prolapsed vaginal walls.     Cervix: Normal.     Uterus: Normal.      Adnexa: Right adnexa normal and left adnexa normal.  Musculoskeletal:     Cervical back: Normal range of motion and neck supple.     Right lower leg: No edema.     Left lower leg: No edema.  Lymphadenopathy:     Cervical: No cervical adenopathy.     Lower Body: No right inguinal adenopathy. No left inguinal adenopathy.  Skin:    General: Skin is warm and dry.  Neurological:     Mental Status: She is alert and oriented to person, place, and time. Mental status is at baseline.  Psychiatric:        Mood and Affect: Mood normal.        Behavior: Behavior normal.      Last depression screening scores    08/01/2024    2:56 PM 06/26/2023    1:54 PM 08/24/2021    1:48  PM  PHQ 2/9 Scores  PHQ - 2 Score 0 1 5  PHQ- 9 Score 4 5  14       Data saved with a previous flowsheet row definition    Last Audit-C alcohol use screening    06/26/2023   12:21 AM  Alcohol Use Disorder Test (AUDIT)  1. How often do you have a drink containing alcohol? 1   2. How many drinks containing alcohol do you have on a typical day when you are drinking? 0   3. How often do you have six or more drinks on one occasion? 0   AUDIT-C Score 1       Manually entered by patient   A score of 3 or more in women, and 4 or more in men indicates increased risk for alcohol abuse, EXCEPT if all of the points are from question 1   Results for orders placed or performed in visit on 08/01/24  CBC with Differential/Platelet  Result Value Ref Range   WBC 11.4 (H) 3.4 - 10.8 x10E3/uL   RBC 5.34 (H) 3.77 - 5.28 x10E6/uL   Hemoglobin 13.5 11.1 - 15.9 g/dL   Hematocrit 55.6 65.9 - 46.6 %   MCV 83 79 - 97 fL   MCH 25.3 (L) 26.6 - 33.0 pg   MCHC 30.5 (L) 31.5 - 35.7 g/dL   RDW 85.9 88.2 - 84.5 %   Platelets 393 150 - 450 x10E3/uL   Neutrophils 72 Not Estab. %   Lymphs 21 Not Estab. %   Monocytes 6 Not Estab. %   Eos 1 Not Estab. %   Basos 0 Not Estab. %   Neutrophils Absolute 8.2 (H) 1.4 - 7.0 x10E3/uL   Lymphocytes Absolute 2.4 0.7 - 3.1 x10E3/uL   Monocytes Absolute 0.7 0.1 - 0.9 x10E3/uL   EOS (ABSOLUTE) 0.1 0.0 - 0.4 x10E3/uL   Basophils Absolute 0.1 0.0 - 0.2 x10E3/uL   Immature Granulocytes 0 Not Estab. %   Immature Grans (Abs) 0.0 0.0 - 0.1 x10E3/uL  Comprehensive metabolic panel with GFR  Result Value Ref Range   Glucose 88 70 - 99 mg/dL   BUN 8 6 - 20 mg/dL   Creatinine, Ser 9.12 0.57 - 1.00 mg/dL   eGFR 97 >40 fO/fpw/8.26   BUN/Creatinine Ratio 9 9 - 23   Sodium 140 134 - 144 mmol/L   Potassium 4.2 3.5 - 5.2 mmol/L   Chloride 99 96 - 106 mmol/L   CO2 27 20 - 29 mmol/L   Calcium 10.4 (H) 8.7 - 10.2 mg/dL   Total Protein 7.2 6.0 - 8.5 g/dL   Albumin 4.4 4.0 - 5.0 g/dL   Globulin, Total 2.8 1.5 - 4.5 g/dL   Bilirubin Total 0.3 0.0 - 1.2 mg/dL   Alkaline Phosphatase 131 (H) 41 - 116 IU/L   AST 20 0 - 40 IU/L   ALT 22 0 - 32 IU/L  Lipid Panel With LDL/HDL Ratio  Result Value Ref Range   Cholesterol, Total 151 100 - 199 mg/dL   Triglycerides 89 0 - 149 mg/dL   HDL 39 (L) >60 mg/dL   VLDL Cholesterol Cal 17 5 - 40 mg/dL   LDL Chol Calc (NIH) 95 0 - 99 mg/dL   LDL/HDL Ratio 2.4 0.0 - 3.2 ratio  POCT urine pregnancy   Result Value Ref Range   Preg Test, Ur Negative Negative  Cytology - PAP  Result Value Ref Range   Neisseria Gonorrhea Negative    Chlamydia Negative  Trichomonas Negative    Adequacy      Satisfactory for evaluation; transformation zone component ABSENT.   Diagnosis      - Negative for intraepithelial lesion or malignancy (NILM)   Microorganisms Shift in flora suggestive of bacterial vaginosis    Comment Normal Reference Range Trichomonas - Negative    Comment Normal Reference Ranger Chlamydia - Negative    Comment      Normal Reference Range Neisseria Gonorrhea - Negative  Cervicovaginal ancillary only  Result Value Ref Range   Bacterial Vaginitis (gardnerella) Positive (A)    Candida Vaginitis Negative    Candida Glabrata Negative    Comment Normal Reference Range Candida Species - Negative    Comment Normal Reference Range Candida Galbrata - Negative    Comment      Normal Reference Range Bacterial Vaginosis - Negative    Assessment & Plan    Routine Health Maintenance and Physical Exam  Exercise Activities and Dietary recommendations  Goals   None     Immunization History  Administered Date(s) Administered   DTaP 06/25/2003, 08/27/2003, 10/27/2003, 07/29/2004, 04/30/2007   HIB (PRP-OMP) 06/25/2003, 08/27/2003, 10/27/2003, 07/29/2004   HPV 9-valent 08/12/2014, 05/12/2015   HPV Quadrivalent 05/06/2014   Hepatitis A 03/30/2009, 04/21/2010   Hepatitis B 10/18/02, 06/25/2003, 08/27/2003, 10/27/2003   IPV 06/25/2003, 08/27/2003, 10/27/2003, 04/30/2007   Influenza Nasal 05/07/2009, 04/21/2010, 05/12/2011, 05/13/2012, 05/02/2013   Influenza,Quad,Nasal, Live 05/02/2013, 05/06/2014   Influenza,inj,Quad PF,6+ Mos 05/12/2015, 06/06/2016, 05/15/2017, 04/17/2018, 06/28/2019, 04/18/2021, 06/19/2023   Influenza-Unspecified 05/20/2024   MMR 04/27/2004, 04/30/2007   Meningococcal B, OMV 08/01/2024   Meningococcal Conjugate 05/06/2014, 09/29/2019   PFIZER Comirnaty(Gray  Top)Covid-19 Tri-Sucrose Vaccine 09/04/2020   PFIZER(Purple Top)SARS-COV-2 Vaccination 12/17/2019, 01/14/2020   PNEUMOCOCCAL CONJUGATE-20 08/01/2024   Pfizer(Comirnaty)Fall Seasonal Vaccine 12 years and older 12/04/2022   Pneumococcal Conjugate-13 06/25/2003, 08/27/2003, 10/27/2003, 04/27/2004   Tdap 05/06/2014, 08/01/2024   Varicella 04/27/2004, 04/30/2007    Health Maintenance  Topic Date Due   COVID-19 Vaccine (5 - 2025-26 season) 03/23/2025 (Originally 03/24/2024)   Meningococcal B Vaccine (2 of 2 - Bexsero SCDM 2-dose series) 01/29/2025   CHLAMYDIA SCREENING  08/01/2025   Cervical Cancer Screening (Pap smear)  08/02/2027   DTaP/Tdap/Td (8 - Td or Tdap) 08/01/2034   Pneumococcal Vaccine  Completed   Influenza Vaccine  Completed   Hepatitis B Vaccines 19-59 Average Risk  Completed   HPV VACCINES  Completed   Hepatitis C Screening  Completed   HIV Screening  Completed    Discussed health benefits of physical activity, and encouraged her to engage in regular exercise appropriate for her age and condition.   Annual physical exam -     CBC with Differential/Platelet -     Comprehensive metabolic panel with GFR -     Lipid Panel With LDL/HDL Ratio  Cervical cancer screening -     Cytology - PAP  Screening for chlamydial disease -     Cytology - PAP  Hydradenitis -     Clindamycin  Phosphate; Apply 1 Application topically 2 (two) times daily.  Dispense: 30 mL; Refill: 2 -     Ambulatory referral to Dermatology  Keloid  Encounter for initial prescription of injectable contraceptive -     POCT urine pregnancy -     medroxyPROGESTERone  Acetate  Mass in neck -     US  SOFT TISSUE HEAD & NECK (NON-THYROID ); Future  Vaginal candidiasis -     Fluconazole ; Take 1 tablet (150 mg total) by mouth every 3 (  three) days for 2 doses.  Dispense: 2 tablet; Refill: 0 -     Cervicovaginal ancillary only  Need for Tdap vaccination -     Tdap vaccine greater than or equal to 7yo  IM  Need for meningococcal vaccination -     Meningococcal B, OMV  Need for pneumococcal 20-valent conjugate vaccination -     Pneumococcal conjugate vaccine 20-valent      Annual physical exam; cervical cancer screening; screening for chlamydial disease Routine wellness visit. Physical exam overall unremarkable except as noted above. Routine lab work ordered as noted.  Discussed Pap smear frequency and HPV testing. Explained bimanual exam purpose. - Conducted blood work. - Performed routine screening for chlamydia. - Prescribed fluconazole  for suspected vaginal candidiasis - Performed pregnancy test before starting Depo-Provera .  Hidradenitis  Chronic, with intermittently recurrent painful bumps under arms and thigh.  Patient is currently without medication to treat. - Prescribed clindamycin  solution as noted.  Keloid Keloid on right ear post-piercing, increased in size. Discussed dermatology referral for potential steroid injections or creams. Explained risks of keloid removal. - Referred to dermatology for evaluation and management of keloid.  Encounter for initial prescription of injectable contraceptive Interested in Depo-Provera  due to forgetfulness with oral contraceptives. Discussed as an alternative. - Performed pregnancy test before starting Depo-Provera  - negative. - Depo-Provera  injection given.  Mass in neck Identified on physical exam on left side only.  Will evaluate with ultrasound as noted.    Return in about 1 year (around 08/01/2025) for CPE w/next provider.     I discussed the assessment and treatment plan with the patient  The patient was provided an opportunity to ask questions and all were answered. The patient agreed with the plan and demonstrated an understanding of the instructions.   The patient was advised to call back or seek an in-person evaluation if the symptoms worsen or if the condition fails to improve as anticipated.    LAURAINE LOISE BUOY,  DO  [facility] [phone] (phone) [phone] (fax)  [facility]     [1] No Known Allergies [2]  Outpatient Medications Prior to Visit  Medication Sig   [DISCONTINUED] clindamycin  (CLEOCIN  T) 1 % external solution Apply 1 Application topically 2 (two) times daily.   VENTOLIN  HFA 108 (90 Base) MCG/ACT inhaler Inhale 2 puffs into the lungs every 4 (four) hours as needed for wheezing or shortness of breath.   [DISCONTINUED] norethindrone -ethinyl estradiol-FE (JUNEL FE 1/20) 1-20 MG-MCG tablet Take 1 tablet by mouth daily.   No facility-administered medications prior to visit.   "

## 2024-08-02 LAB — CBC WITH DIFFERENTIAL/PLATELET
Basophils Absolute: 0.1 x10E3/uL (ref 0.0–0.2)
Basos: 0 %
EOS (ABSOLUTE): 0.1 x10E3/uL (ref 0.0–0.4)
Eos: 1 %
Hematocrit: 44.3 % (ref 34.0–46.6)
Hemoglobin: 13.5 g/dL (ref 11.1–15.9)
Immature Grans (Abs): 0 x10E3/uL (ref 0.0–0.1)
Immature Granulocytes: 0 %
Lymphocytes Absolute: 2.4 x10E3/uL (ref 0.7–3.1)
Lymphs: 21 %
MCH: 25.3 pg — ABNORMAL LOW (ref 26.6–33.0)
MCHC: 30.5 g/dL — ABNORMAL LOW (ref 31.5–35.7)
MCV: 83 fL (ref 79–97)
Monocytes Absolute: 0.7 x10E3/uL (ref 0.1–0.9)
Monocytes: 6 %
Neutrophils Absolute: 8.2 x10E3/uL — ABNORMAL HIGH (ref 1.4–7.0)
Neutrophils: 72 %
Platelets: 393 x10E3/uL (ref 150–450)
RBC: 5.34 x10E6/uL — ABNORMAL HIGH (ref 3.77–5.28)
RDW: 14 % (ref 11.7–15.4)
WBC: 11.4 x10E3/uL — ABNORMAL HIGH (ref 3.4–10.8)

## 2024-08-02 LAB — COMPREHENSIVE METABOLIC PANEL WITH GFR
ALT: 22 IU/L (ref 0–32)
AST: 20 IU/L (ref 0–40)
Albumin: 4.4 g/dL (ref 4.0–5.0)
Alkaline Phosphatase: 131 IU/L — ABNORMAL HIGH (ref 41–116)
BUN/Creatinine Ratio: 9 (ref 9–23)
BUN: 8 mg/dL (ref 6–20)
Bilirubin Total: 0.3 mg/dL (ref 0.0–1.2)
CO2: 27 mmol/L (ref 20–29)
Calcium: 10.4 mg/dL — ABNORMAL HIGH (ref 8.7–10.2)
Chloride: 99 mmol/L (ref 96–106)
Creatinine, Ser: 0.87 mg/dL (ref 0.57–1.00)
Globulin, Total: 2.8 g/dL (ref 1.5–4.5)
Glucose: 88 mg/dL (ref 70–99)
Potassium: 4.2 mmol/L (ref 3.5–5.2)
Sodium: 140 mmol/L (ref 134–144)
Total Protein: 7.2 g/dL (ref 6.0–8.5)
eGFR: 97 mL/min/1.73

## 2024-08-02 LAB — LIPID PANEL WITH LDL/HDL RATIO
Cholesterol, Total: 151 mg/dL (ref 100–199)
HDL: 39 mg/dL — ABNORMAL LOW
LDL Chol Calc (NIH): 95 mg/dL (ref 0–99)
LDL/HDL Ratio: 2.4 ratio (ref 0.0–3.2)
Triglycerides: 89 mg/dL (ref 0–149)
VLDL Cholesterol Cal: 17 mg/dL (ref 5–40)

## 2024-08-06 ENCOUNTER — Ambulatory Visit
Admission: RE | Admit: 2024-08-06 | Discharge: 2024-08-06 | Disposition: A | Discharge status: Home / Self Care | Source: Ambulatory Visit | Attending: Family Medicine | Admitting: Family Medicine

## 2024-08-06 DIAGNOSIS — R221 Localized swelling, mass and lump, neck: Secondary | ICD-10-CM | POA: Diagnosis present

## 2024-08-08 LAB — CYTOLOGY - PAP
Adequacy: ABSENT
Chlamydia: NEGATIVE
Comment: NEGATIVE
Comment: NEGATIVE
Comment: NORMAL
Diagnosis: NEGATIVE
Neisseria Gonorrhea: NEGATIVE
Trichomonas: NEGATIVE

## 2024-08-08 LAB — CERVICOVAGINAL ANCILLARY ONLY
Bacterial Vaginitis (gardnerella): POSITIVE — AB
Candida Glabrata: NEGATIVE
Candida Vaginitis: NEGATIVE
Comment: NEGATIVE
Comment: NEGATIVE
Comment: NEGATIVE

## 2024-08-11 ENCOUNTER — Ambulatory Visit

## 2024-08-13 ENCOUNTER — Ambulatory Visit: Payer: Self-pay | Admitting: Family Medicine

## 2024-08-13 ENCOUNTER — Encounter: Payer: Self-pay | Admitting: Family Medicine

## 2024-08-13 DIAGNOSIS — B9689 Other specified bacterial agents as the cause of diseases classified elsewhere: Secondary | ICD-10-CM

## 2024-08-13 DIAGNOSIS — D72829 Elevated white blood cell count, unspecified: Secondary | ICD-10-CM

## 2024-08-13 MED ORDER — METRONIDAZOLE 500 MG PO TABS: 500.0000 mg | ORAL_TABLET | Freq: Two times a day (BID) | ORAL | 0 refills | Status: AC
